# Patient Record
Sex: Female | Born: 2000
Health system: Southern US, Community
[De-identification: ages and names within clinical notes are randomized; demographics above are authoritative.]

## PROBLEM LIST (undated history)

## (undated) DIAGNOSIS — T7840XA Allergy, unspecified, initial encounter: Secondary | ICD-10-CM

## (undated) DIAGNOSIS — E282 Polycystic ovarian syndrome: Secondary | ICD-10-CM

## (undated) DIAGNOSIS — F419 Anxiety disorder, unspecified: Secondary | ICD-10-CM

## (undated) HISTORY — DX: Allergy, unspecified, initial encounter: T78.40XA

## (undated) HISTORY — DX: Anxiety disorder, unspecified: F41.9

---
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
## 2020-05-10 ENCOUNTER — Other Ambulatory Visit: Payer: Self-pay | Admitting: Family Medicine

## 2020-05-10 DIAGNOSIS — E282 Polycystic ovarian syndrome: Secondary | ICD-10-CM

## 2020-05-10 DIAGNOSIS — R1011 Right upper quadrant pain: Secondary | ICD-10-CM

## 2020-05-10 DIAGNOSIS — N912 Amenorrhea, unspecified: Secondary | ICD-10-CM

## 2020-05-25 ENCOUNTER — Ambulatory Visit
Admission: RE | Admit: 2020-05-25 | Discharge: 2020-05-25 | Disposition: A | Discharge status: Home / Self Care | Payer: 59 | Source: Ambulatory Visit | Attending: Family Medicine | Admitting: Family Medicine

## 2020-05-25 ENCOUNTER — Other Ambulatory Visit: Payer: Self-pay

## 2020-05-25 DIAGNOSIS — E282 Polycystic ovarian syndrome: Secondary | ICD-10-CM

## 2020-05-25 DIAGNOSIS — R1011 Right upper quadrant pain: Secondary | ICD-10-CM

## 2020-05-25 DIAGNOSIS — N912 Amenorrhea, unspecified: Secondary | ICD-10-CM

## 2021-10-27 IMAGING — US US PELVIS COMPLETE WITH TRANSVAGINAL
1 series · 14 of 25 positions shown · non-contrast
Comparison: None

CLINICAL DATA: Known PCOS by report is.  Amenorrhea.

EXAM:
TRANSABDOMINAL AND TRANSVAGINAL ULTRASOUND OF PELVIS
TECHNIQUE: Both transabdominal and transvaginal ultrasound examinations of the
pelvis were performed. Transabdominal technique was performed for
global imaging of the pelvis including uterus, ovaries, adnexal
regions, and pelvic cul-de-sac. It was necessary to proceed with
endovaginal exam following the transabdominal exam to visualize the
uterus, endometrium and LEFT and RIGHT ovary.

[Series 1: us pelvis complete with transvaginal · 0.31mm/px · 14 of 37 slices shown]
[im 1/37]
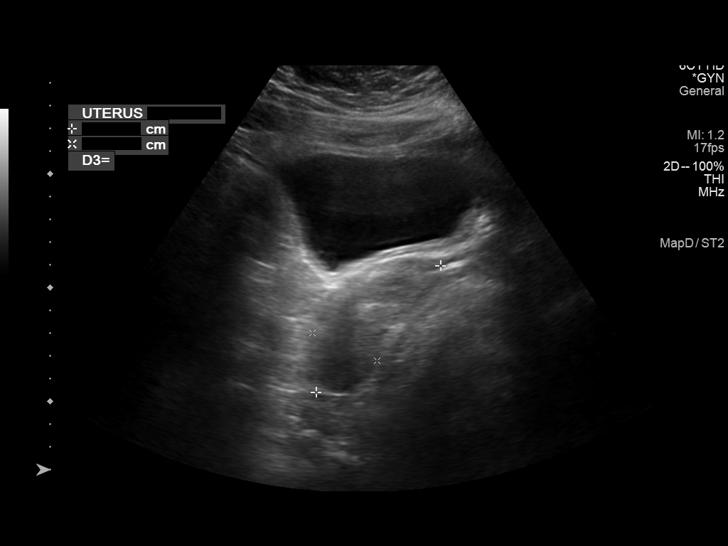
[im 4/37]
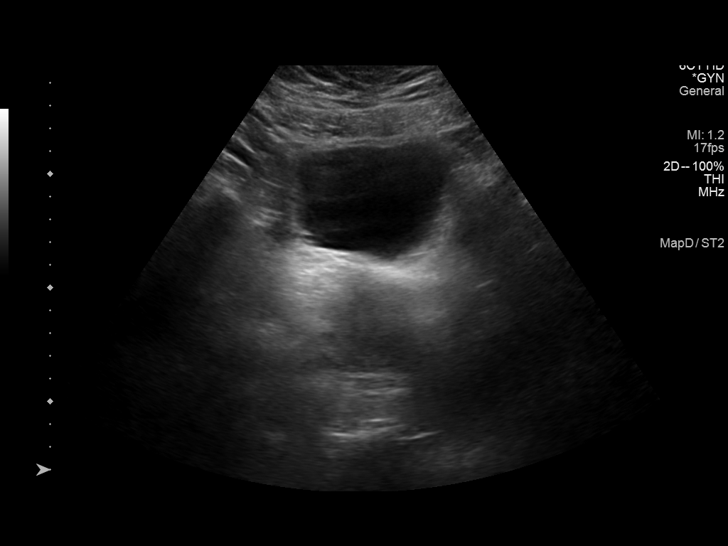
[im 7/37]
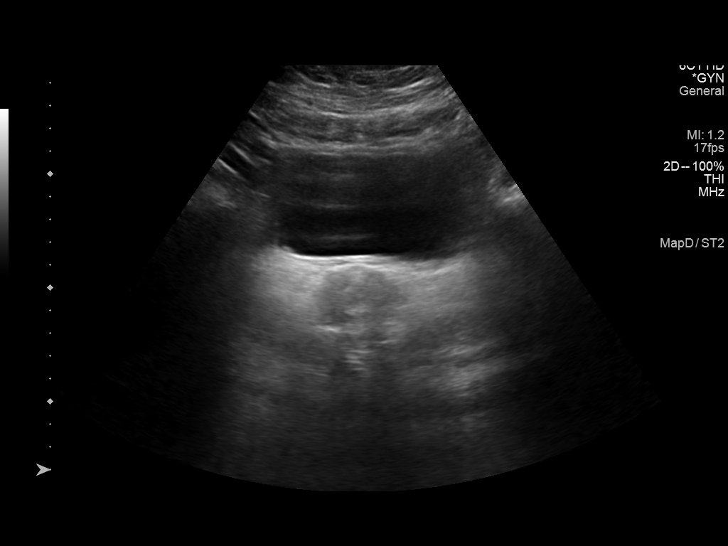
[im 10/37]
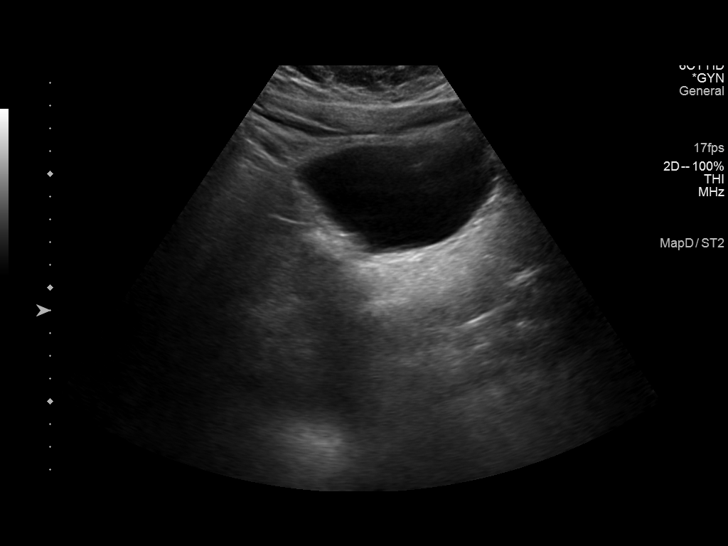
[im 13/37]
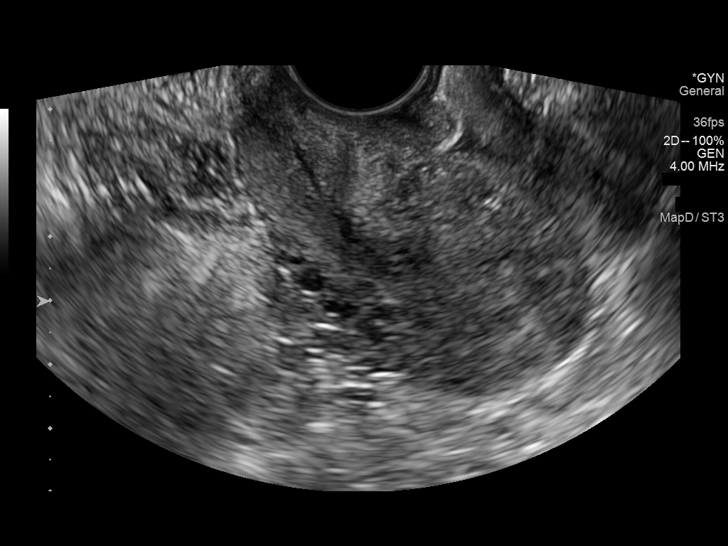
[im 14/37]
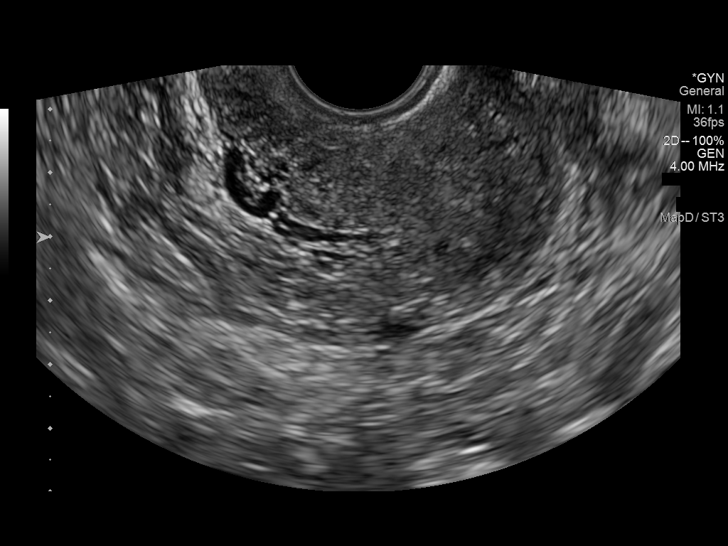
[im 17/37]
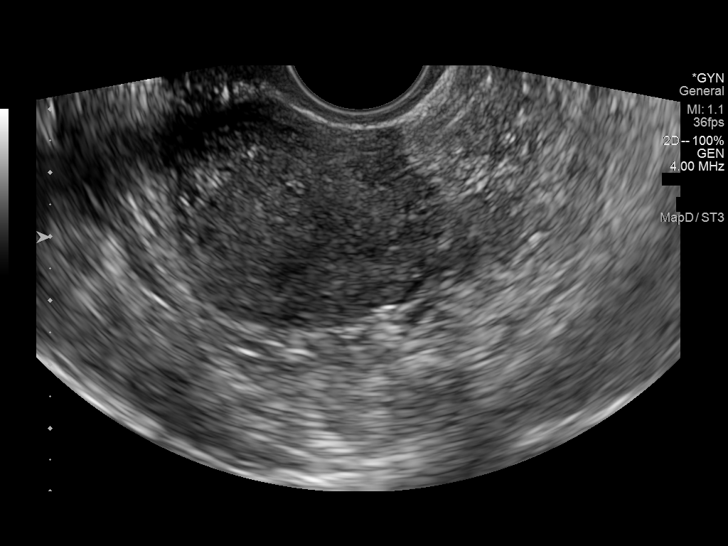
[im 20/37]
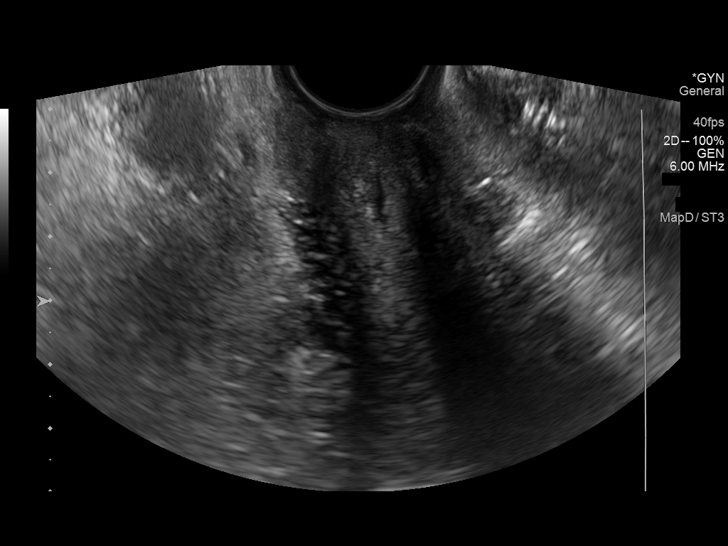
[im 23/37]
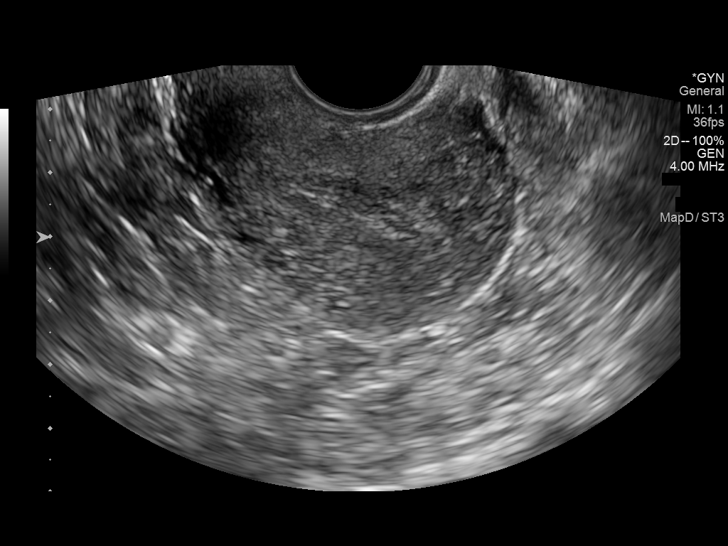
[im 25/37]
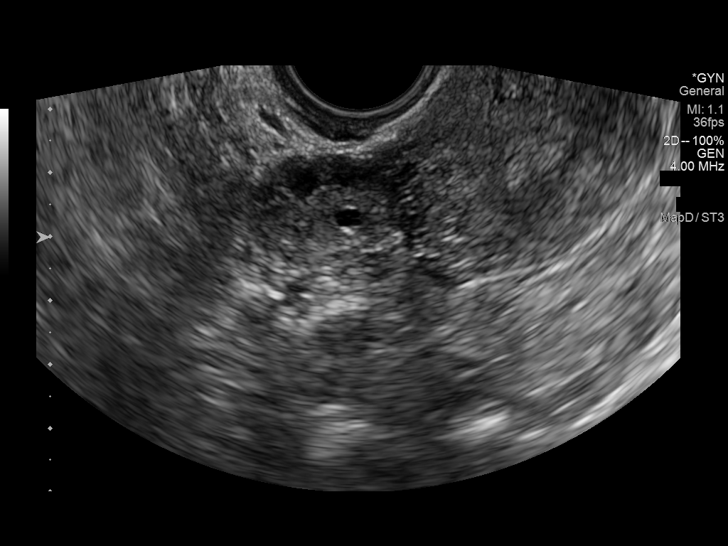
[im 28/37]
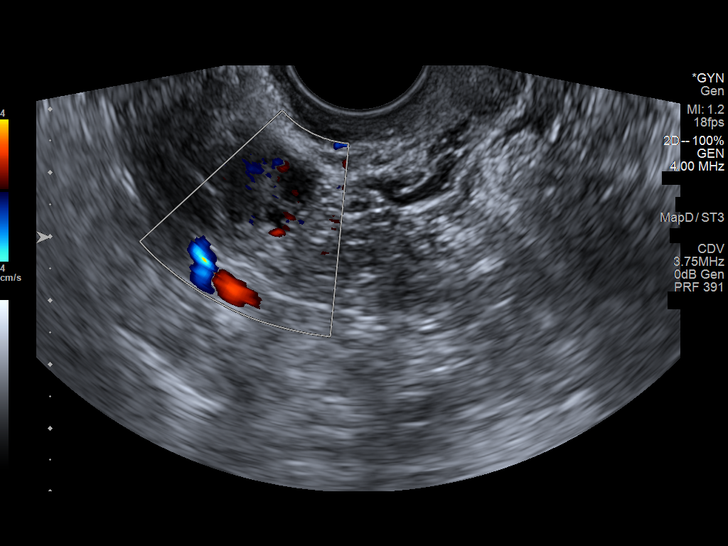
[im 31/37]
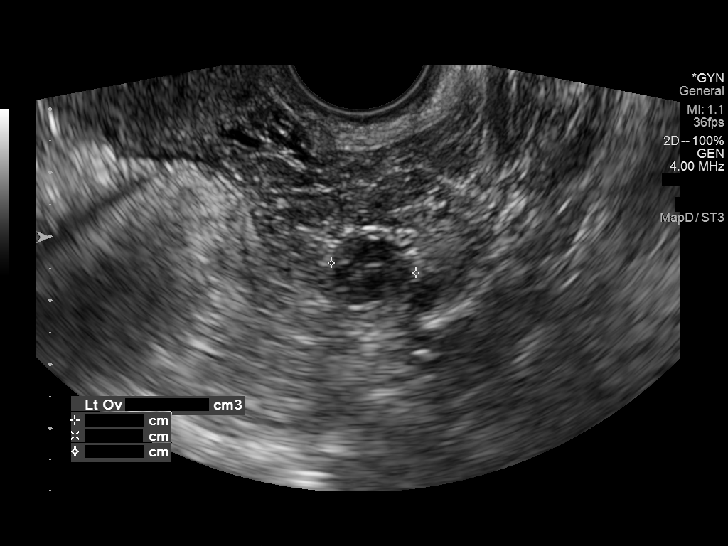
[im 34/37]
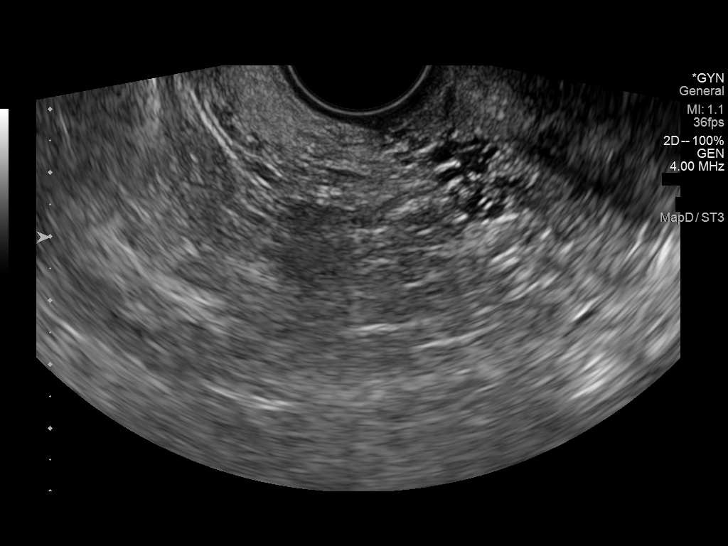
[im 37/37]
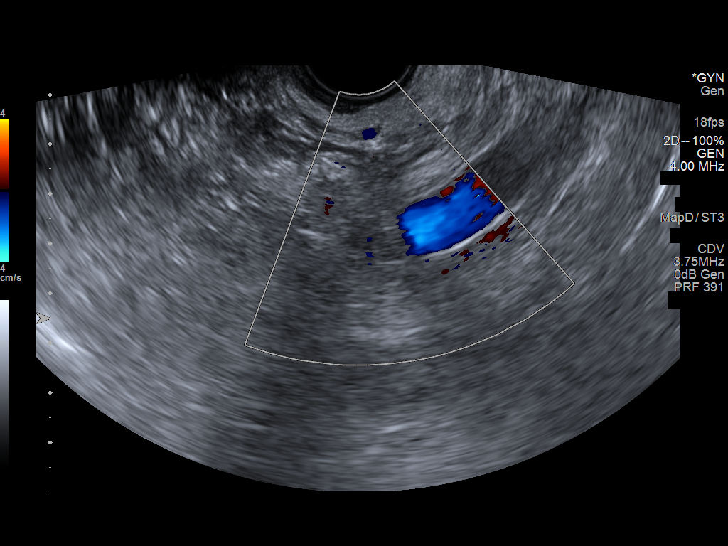

[14 of 25 positions shown; findings below may reference images not displayed]

FINDINGS: Uterus

Measurements: 7.8 x 3.1 x 3.8 cm = volume: 48.1 mL. Retroverted
without leiomyoma or mass.

Endometrium

Thickness: 6.2 mm.  No focal abnormality visualized.

Right ovary

Measurements: 2.6 x 1.5 x 1.4 cm = volume: 2.8 mL. Normal
appearance/no adnexal mass.

Left ovary

Measurements: 2.1 x 1.4 x 1.3 cm = volume: 2.0 mL. Normal
appearance/no adnexal mass.

Other findings

Trace free fluid
IMPRESSION: Unremarkable pelvic sonogram.

## 2021-12-21 ENCOUNTER — Encounter (HOSPITAL_BASED_OUTPATIENT_CLINIC_OR_DEPARTMENT_OTHER): Payer: Self-pay | Admitting: Emergency Medicine

## 2021-12-21 ENCOUNTER — Other Ambulatory Visit: Payer: Self-pay

## 2021-12-21 ENCOUNTER — Emergency Department (HOSPITAL_BASED_OUTPATIENT_CLINIC_OR_DEPARTMENT_OTHER)
Admission: EM | Admit: 2021-12-21 | Discharge: 2021-12-21 | Disposition: A | Discharge status: Home / Self Care | Payer: 59 | Attending: Emergency Medicine | Admitting: Emergency Medicine

## 2021-12-21 DIAGNOSIS — R059 Cough, unspecified: Secondary | ICD-10-CM | POA: Diagnosis present

## 2021-12-21 DIAGNOSIS — Z1152 Encounter for screening for COVID-19: Secondary | ICD-10-CM | POA: Diagnosis not present

## 2021-12-21 DIAGNOSIS — J069 Acute upper respiratory infection, unspecified: Secondary | ICD-10-CM | POA: Insufficient documentation

## 2021-12-21 LAB — SARS CORONAVIRUS 2 BY RT PCR: SARS Coronavirus 2 by RT PCR: NEGATIVE

## 2021-12-21 LAB — GROUP A STREP BY PCR: Group A Strep by PCR: NOT DETECTED

## 2021-12-21 MED ORDER — BENZONATATE 100 MG PO CAPS: 100.0000 mg | ORAL_CAPSULE | Freq: Three times a day (TID) | ORAL | 0 refills | Status: AC

## 2021-12-21 NOTE — ED Triage Notes (Signed)
Symptoms started Tuesday, fever, aches, chills, head congestion, sore throat.

## 2021-12-21 NOTE — Discharge Instructions (Addendum)
You were seen in the emergency department today for cough and sore throat.  I am sending a prescription for Tessalon which is a cough medication that you can use every 8 hours.  Continue taking over-the-counter medications for your symptoms like Tylenol and Motrin for pain or fever, Mucinex for congestion, lozenges for sore throat.  Please return to emergency department if you have developed difficulty swallowing liquids or have difficulty breathing or shortness of breath.

## 2021-12-21 NOTE — ED Provider Notes (Signed)
[facility] EMERGENCY DEPT Provider Note   CSN: [id number] Arrival date & time: 12/21/21  1035     History  Chief Complaint  Patient presents with   URI    Crystal Curtis is a 21 y.o. female.  With past medical history of GAD, PCOS who presents to the emergency department with viral symptoms.  Symptoms began on Tuesday.  The patient describes having "flulike symptoms."  She states that she has had nonproductive cough, sore throat, congestion, body aches, general malaise.  She states that she had a fever yesterday but was able to defervesce with Tylenol.  She states that she has been using over-the-counter and home remedies to treat her symptoms but does not seem to be improving.  She denies having any shortness of breath or difficulty breathing.   URI Presenting symptoms: congestion, cough, fatigue, fever and sore throat        Home Medications Prior to Admission medications   Medication Sig Start Date End Date Taking? Authorizing Provider  benzonatate (TESSALON) 100 MG capsule Take 1 capsule (100 mg total) by mouth every 8 (eight) hours. 12/21/21  Yes Cristopher Peru, PA-C  cholecalciferol (VITAMIN D) 1000 units tablet Take 1,000 Units by mouth daily.    [provider]  loratadine (CLARITIN) 10 MG tablet Take 10 mg by mouth.    [provider]  norethindrone-ethinyl estradiol-iron (JUNEL FE 1.5/30) 1.5-30 MG-MCG tablet Take 1 tablet by mouth daily. 06/20/18   Georges Mouse, NP  sertraline (ZOLOFT) 100 MG tablet TAKE 1 TABLET BY MOUTH EVERY DAY 10/21/18   Verneda Skill, FNP      Allergies    Patient has no known allergies.    Review of Systems   Review of Systems  Constitutional:  Positive for fatigue and fever.  HENT:  Positive for congestion and sore throat.   Respiratory:  Positive for cough. Negative for shortness of breath.   All other systems reviewed and are negative.   Physical Exam Updated Vital Signs BP 111/78 (BP Location:  Left Arm)   Pulse 80   Temp 97.8 F (36.6 C) (Oral)   Resp 16   Ht 5\' 4"  (1.626 m)   Wt 104.3 kg   SpO2 100%   BMI 39.48 kg/m  Physical Exam Vitals and nursing note reviewed.  Constitutional:      General: She is not in acute distress.    Appearance: Normal appearance. She is ill-appearing. She is not toxic-appearing.  HENT:     Head: Normocephalic.     Nose: Congestion present.     Mouth/Throat:     Mouth: Mucous membranes are moist.     Pharynx: Uvula midline. Posterior oropharyngeal erythema present.     Tonsils: No tonsillar exudate or tonsillar abscesses. 1+ on the right. 1+ on the left.  Eyes:     General: No scleral icterus.    Extraocular Movements: Extraocular movements intact.     Pupils: Pupils are equal, round, and reactive to light.  Cardiovascular:     Rate and Rhythm: Normal rate and regular rhythm.     Pulses: Normal pulses.     Heart sounds: No murmur heard. Pulmonary:     Effort: Pulmonary effort is normal. No respiratory distress.     Breath sounds: Normal breath sounds. No wheezing, rhonchi or rales.  Abdominal:     General: Bowel sounds are normal.     Palpations: Abdomen is soft.  Musculoskeletal:     Cervical back: Neck  supple.  Lymphadenopathy:     Cervical: Cervical adenopathy present.  Skin:    General: Skin is warm and dry.     Capillary Refill: Capillary refill takes less than 2 seconds.  Neurological:     General: No focal deficit present.     Mental Status: She is alert and oriented to person, place, and time. Mental status is at baseline.  Psychiatric:        Mood and Affect: Mood normal.        Behavior: Behavior normal.        Thought Content: Thought content normal.        Judgment: Judgment normal.     ED Results / Procedures / Treatments   Labs (all labs ordered are listed, but only abnormal results are displayed) Labs Reviewed  SARS CORONAVIRUS 2 BY RT PCR  GROUP A STREP BY PCR  RESP PANEL BY RT-PCR (FLU A&B, COVID)  ARPGX2    EKG None  Radiology No results found.  Procedures Procedures    Medications Ordered in ED Medications - No data to display  ED Course/ Medical Decision Making/ A&P                           Medical Decision Making Risk Prescription drug management.   Initial Impression and Ddx 21 year old female, nonseptic, nontoxic in appearance.  She does sound congested.  Lungs are clear bilaterally.  She is having no shortness of breath or difficulty breathing or hypoxia.  She has sore throat but there is no trismus, evidence of PTA, Ludwig's angina.  She is tolerating liquids without difficulty. Will obtain COVID fluid strep testing Patient PMH that increases complexity of ED encounter: None  Interpretation of Diagnostics I independent reviewed and interpreted the labs as followed: Strep negative, initial COVID test negative, flu pending  No imaging indicated at this time  Patient Reassessment and Ultimate Disposition/Management 21 year old female who presents with viral upper respiratory infection with cough.  Strep negative, COVID-negative.  Flu is pending.  Feel that she is overall safe for discharge.  She does not require imaging as her lungs are clear without hypoxia or difficulty breathing.  She is young and otherwise healthy.  This is a likely viral upper respiratory infection requiring symptomatic treatment.  I will prescribe her Tessalon.  Her symptoms are not consistent with a bacterial pneumonia, Ludwig's angina, PTA, RPA, bacterial tracheitis, epiglottitis.  Given return precautions for worsening shortness of breath or difficulty breathing.  She verbalized understanding.  Patient management required discussion with the following services or consulting groups:  None  Complexity of Problems Addressed Acute uncomplicated illness or injury with no diagnostics  Additional Data Reviewed and Analyzed Further history obtained from: Recent PCP notes  Patient Encounter  Risk Assessment Prescriptions  Final Clinical Impression(s) / ED Diagnoses Final diagnoses:  Viral URI with cough    Rx / DC Orders ED Discharge Orders          Ordered    benzonatate (TESSALON) 100 MG capsule  Every 8 hours        12/21/21 1328              Cristopher Peru, PA-C 12/21/21 1332    Derwood Kaplan, MD 12/21/21 1348
